# Patient Record
Sex: Female | Born: 1980 | Race: Black or African American | Hispanic: No | Marital: Married | Smoking: Never smoker
Health system: Southern US, Community
[De-identification: ages and names within clinical notes are randomized; demographics above are authoritative.]

## PROBLEM LIST (undated history)

## (undated) DIAGNOSIS — M1990 Unspecified osteoarthritis, unspecified site: Secondary | ICD-10-CM

## (undated) HISTORY — PX: CORONARY ANGIOGRAM: SHX5786

## (undated) HISTORY — DX: Unspecified osteoarthritis, unspecified site: M19.90

## (undated) HISTORY — PX: OTHER SURGICAL HISTORY: SHX169

---
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
## 2004-07-04 ENCOUNTER — Emergency Department (HOSPITAL_COMMUNITY): Admission: EM | Admit: 2004-07-04 | Discharge: 2004-07-04 | Payer: Self-pay | Admitting: Emergency Medicine

## 2005-07-11 ENCOUNTER — Emergency Department (HOSPITAL_COMMUNITY): Admission: EM | Admit: 2005-07-11 | Discharge: 2005-07-11 | Payer: Self-pay | Admitting: Family Medicine

## 2005-09-04 ENCOUNTER — Emergency Department (HOSPITAL_COMMUNITY): Admission: EM | Admit: 2005-09-04 | Discharge: 2005-09-04 | Payer: Self-pay | Admitting: Emergency Medicine

## 2006-01-27 ENCOUNTER — Emergency Department (HOSPITAL_COMMUNITY): Admission: EM | Admit: 2006-01-27 | Discharge: 2006-01-28 | Payer: Self-pay | Admitting: Emergency Medicine

## 2006-06-11 ENCOUNTER — Emergency Department (HOSPITAL_COMMUNITY): Admission: EM | Admit: 2006-06-11 | Discharge: 2006-06-11 | Payer: Self-pay | Admitting: Emergency Medicine

## 2006-08-20 ENCOUNTER — Emergency Department (HOSPITAL_COMMUNITY): Admission: EM | Admit: 2006-08-20 | Discharge: 2006-08-20 | Payer: Self-pay | Admitting: Emergency Medicine

## 2007-02-01 ENCOUNTER — Emergency Department (HOSPITAL_COMMUNITY): Admission: EM | Admit: 2007-02-01 | Discharge: 2007-02-01 | Payer: Self-pay | Admitting: Emergency Medicine

## 2011-02-18 LAB — POCT URINALYSIS DIP (DEVICE)
Glucose, UA: NEGATIVE
Hgb urine dipstick: NEGATIVE
Ketones, ur: 40 — AB
Nitrite: NEGATIVE
Operator id: 247071
Protein, ur: 30 — AB
Specific Gravity, Urine: 1.02
Urobilinogen, UA: 1
pH: 7

## 2011-02-18 LAB — DIFFERENTIAL
Basophils Relative: 0
Eosinophils Absolute: 0
Monocytes Relative: 9
Neutrophils Relative %: 54

## 2011-02-18 LAB — CBC
MCHC: 33.6
MCV: 88
Platelets: 283

## 2011-02-18 LAB — I-STAT 8, (EC8 V) (CONVERTED LAB)
Bicarbonate: 22.6
Glucose, Bld: 89
Hemoglobin: 14.6
Operator id: 239701
Sodium: 139
TCO2: 24
pCO2, Ven: 34.6 — ABNORMAL LOW

## 2012-03-17 ENCOUNTER — Telehealth: Payer: Self-pay

## 2012-03-17 NOTE — Telephone Encounter (Signed)
Pt Signed Release Form, she faxed to Me I have Copied all Records And Mailed to Pt Address at  [address] [place] [address] 03/17/12/KM

## 2018-11-07 ENCOUNTER — Other Ambulatory Visit: Payer: Self-pay | Admitting: Family Medicine

## 2018-11-07 ENCOUNTER — Other Ambulatory Visit (HOSPITAL_COMMUNITY): Payer: Self-pay | Admitting: Family Medicine

## 2018-11-07 DIAGNOSIS — R221 Localized swelling, mass and lump, neck: Secondary | ICD-10-CM

## 2018-11-07 DIAGNOSIS — I719 Aortic aneurysm of unspecified site, without rupture: Secondary | ICD-10-CM

## 2018-11-08 ENCOUNTER — Other Ambulatory Visit: Payer: Self-pay

## 2018-11-08 ENCOUNTER — Ambulatory Visit (HOSPITAL_COMMUNITY)
Admission: RE | Admit: 2018-11-08 | Discharge: 2018-11-08 | Disposition: A | Discharge status: Home / Self Care | Source: Ambulatory Visit | Attending: Family Medicine | Admitting: Family Medicine

## 2018-11-08 ENCOUNTER — Encounter (HOSPITAL_COMMUNITY): Payer: Self-pay

## 2018-11-08 DIAGNOSIS — I719 Aortic aneurysm of unspecified site, without rupture: Secondary | ICD-10-CM | POA: Diagnosis not present

## 2018-11-08 DIAGNOSIS — R221 Localized swelling, mass and lump, neck: Secondary | ICD-10-CM | POA: Insufficient documentation

## 2018-11-08 MED ORDER — IOHEXOL 300 MG/ML  SOLN
100.0000 mL | Freq: Once | INTRAMUSCULAR | Status: AC | PRN
  Administered 2018-11-08: 10:00:00 100 mL via INTRAVENOUS

## 2018-11-08 MED ORDER — SODIUM CHLORIDE (PF) 0.9 % IJ SOLN
INTRAMUSCULAR | Status: AC
  Filled 2018-11-08: qty 50

## 2018-11-10 ENCOUNTER — Emergency Department (HOSPITAL_COMMUNITY)
Admission: EM | Admit: 2018-11-10 | Discharge: 2018-11-10 | Disposition: A | Discharge status: Home / Self Care | Attending: Emergency Medicine | Admitting: Emergency Medicine

## 2018-11-10 ENCOUNTER — Other Ambulatory Visit: Payer: Self-pay

## 2018-11-10 ENCOUNTER — Encounter (HOSPITAL_COMMUNITY): Payer: Self-pay | Admitting: Emergency Medicine

## 2018-11-10 DIAGNOSIS — E876 Hypokalemia: Secondary | ICD-10-CM

## 2018-11-10 DIAGNOSIS — R202 Paresthesia of skin: Secondary | ICD-10-CM | POA: Insufficient documentation

## 2018-11-10 LAB — CBG MONITORING, ED: Glucose-Capillary: 85 mg/dL (ref 70–99)

## 2018-11-10 LAB — CBC
HCT: 39.8 % (ref 36.0–46.0)
Hemoglobin: 13.2 g/dL (ref 12.0–15.0)
MCH: 29.7 pg (ref 26.0–34.0)
MCHC: 33.2 g/dL (ref 30.0–36.0)
MCV: 89.6 fL (ref 80.0–100.0)
Platelets: 301 10*3/uL (ref 150–400)
RBC: 4.44 MIL/uL (ref 3.87–5.11)
RDW: 12.5 % (ref 11.5–15.5)
WBC: 6.3 10*3/uL (ref 4.0–10.5)
nRBC: 0 % (ref 0.0–0.2)

## 2018-11-10 LAB — BASIC METABOLIC PANEL
Anion gap: 7 (ref 5–15)
BUN: 5 mg/dL — ABNORMAL LOW (ref 6–20)
CO2: 25 mmol/L (ref 22–32)
Calcium: 9 mg/dL (ref 8.9–10.3)
Chloride: 109 mmol/L (ref 98–111)
Creatinine, Ser: 0.8 mg/dL (ref 0.44–1.00)
GFR calc Af Amer: >60 mL/min (ref >60)
GFR calc non Af Amer: >60 mL/min (ref >60)
Glucose, Bld: 98 mg/dL (ref 70–99)
Potassium: 3.3 mmol/L — ABNORMAL LOW (ref 3.5–5.1)
Sodium: 141 mmol/L (ref 135–145)

## 2018-11-10 LAB — URINALYSIS, ROUTINE W REFLEX MICROSCOPIC
Bilirubin Urine: NEGATIVE
Glucose, UA: NEGATIVE mg/dL
Hgb urine dipstick: NEGATIVE
Ketones, ur: NEGATIVE mg/dL
Nitrite: NEGATIVE
Protein, ur: NEGATIVE mg/dL
Specific Gravity, Urine: 1.017 (ref 1.005–1.030)
pH: 7 (ref 5.0–8.0)

## 2018-11-10 LAB — I-STAT BETA HCG BLOOD, ED (MC, WL, AP ONLY): I-stat hCG, quantitative: <5 m[IU]/mL (ref <5)

## 2018-11-10 MED ORDER — POTASSIUM CHLORIDE CRYS ER 20 MEQ PO TBCR: 40.0000 meq | EXTENDED_RELEASE_TABLET | Freq: Every day | ORAL | 0 refills | Status: DC

## 2018-11-10 MED ORDER — SODIUM CHLORIDE 0.9% FLUSH: 3.0000 mL | Freq: Once | INTRAVENOUS | Status: DC

## 2018-11-10 NOTE — ED Notes (Signed)
Pt verbalized understanding of discharge instructions and denies any further questions at this time.   

## 2018-11-10 NOTE — ED Provider Notes (Signed)
Emergency Department Provider Note   I have reviewed the triage vital signs and the nursing notes.   HISTORY  Chief Complaint Fatigue   HPI Christina House is a 38 y.o. female presents to the emergency department for evaluation of tingling in the hands and feet mainly when waking up from sleep.  Patient describes associated joint discomfort and some fatigue.  Patient states when she goes to sleep she feels fine and then wakes up after several hours and will have tingling in the hands, face.  Denies gasping for breath.  No sensation of being paralyzed after waking.  She tends to fall asleep without much difficulty.  No other medications.  No fevers or chills.  She is from [place] and is visiting family here for the next 2 weeks.  She had some right neck swelling and thought it could be a lymph node.  She had a CT scan of the neck along with abdomen and pelvis yesterday which were unremarkable. No HA.    History reviewed. No pertinent past medical history.  There are no active problems to display for this patient.   History reviewed. No pertinent surgical history.  Allergies Oysters [shellfish allergy]  No family history on file.  Social History Social History   Tobacco Use  . Smoking status: Never Smoker  . Smokeless tobacco: Never Used  Substance Use Topics  . Alcohol use: Yes  . Drug use: Not Currently    Review of Systems  Constitutional: No fever/chills Eyes: No visual changes. ENT: No sore throat. Cardiovascular: Denies chest pain. Respiratory: Denies shortness of breath. Gastrointestinal: No abdominal pain.  No nausea, no vomiting.  No diarrhea.  No constipation. Genitourinary: Negative for dysuria. Musculoskeletal: Negative for back pain. Positive diffuse joint pain.  Skin: Negative for rash. Neurological: Negative for headaches, focal weakness or numbness. Positive paresthesias.   10-point ROS otherwise negative.   ____________________________________________   PHYSICAL EXAM:  VITAL SIGNS: ED Triage Vitals  Enc Vitals Group     BP 11/10/18 1314 114/87     Pulse Rate 11/10/18 1314 67     Resp 11/10/18 1314 16     Temp 11/10/18 1314 98.3 F (36.8 C)     Temp Source 11/10/18 1314 Oral     SpO2 11/10/18 1314 100 %   Constitutional: Alert and oriented. Well appearing and in no acute distress. Eyes: Conjunctivae are normal.  Head: Atraumatic. Nose: No congestion/rhinnorhea. Mouth/Throat: Mucous membranes are moist. Neck: No stridor.  Cardiovascular: Normal rate, regular rhythm. Good peripheral circulation. Grossly normal heart sounds.   Respiratory: Normal respiratory effort.  No retractions. Lungs CTAB. Gastrointestinal: Soft and nontender. No distention.  Musculoskeletal: No lower extremity tenderness nor edema. No gross deformities of extremities. Neurologic:  Normal speech and language. No gross focal neurologic deficits are appreciated.  Skin:  Skin is warm, dry and intact. No rash noted.  ____________________________________________   LABS (all labs ordered are listed, but only abnormal results are displayed)  Labs Reviewed  BASIC METABOLIC PANEL - Abnormal; Notable for the following components:      Result Value   Potassium 3.3 (*)    BUN 5 (*)    All other components within normal limits  URINALYSIS, ROUTINE W REFLEX MICROSCOPIC - Abnormal; Notable for the following components:   APPearance HAZY (*)    Leukocytes,Ua SMALL (*)    Bacteria, UA RARE (*)    All other components within normal limits  CBC  CBG MONITORING, ED  I-STAT BETA HCG  BLOOD, ED ([facility], [facility], [facility] ONLY)   ____________________________________________  RADIOLOGY  None  ____________________________________________   PROCEDURES  Procedure(s) performed:   Procedures  None  ____________________________________________   INITIAL IMPRESSION / ASSESSMENT AND PLAN / ED COURSE  Pertinent labs & imaging  results that were available during my care of the patient were reviewed by me and considered in my medical decision making (see chart for details).   Patient presents to the emergency department with paresthesias mainly at night.  No active neuro deficits.  Neurologic exam is normal.  Labs ordered from triage in mild hypokalemia.  Plan for potassium supplementation.  UA pending.  Advised to follow-up with PCP for sleep study when she returns home.   UA equivocal. Plan for potassium supplementation and PCP follow up. Discussed ED return precautions.  ____________________________________________  FINAL CLINICAL IMPRESSION(S) / ED DIAGNOSES  Final diagnoses:  Paresthesias  Hypokalemia     MEDICATIONS GIVEN DURING THIS VISIT:  Medications  sodium chloride flush (NS) 0.9 % injection 3 mL (has no administration in time range)     NEW OUTPATIENT MEDICATIONS STARTED DURING THIS VISIT:  New Prescriptions   POTASSIUM CHLORIDE SA (K-DUR) 20 MEQ TABLET    Take 2 tablets (40 mEq total) by mouth daily for 5 days.    Note:  This document was prepared using Dragon voice recognition software and may include unintentional dictation errors.  Alona BeneJoshua Sendy Pluta, MD Emergency Medicine    Garyson Stelly, Arlyss RepressJoshua G, MD 11/10/18 [id number]

## 2018-11-10 NOTE — Discharge Instructions (Signed)
You were seen in the emergency department today with tingling in the hands and face.  Your potassium is slightly low.  I plan on giving you a replacement potassium over the next 5 days.  Please contact your PCP when you return home.  Return to the emergency department immediately with any new or suddenly worsening symptoms.

## 2018-11-10 NOTE — ED Triage Notes (Signed)
Pt with multiple complaints of malaise, joint pain, vision changes. Recently in [place] June 22 where she resides,  CT scan completed at [facility]. Denies fevers n/v/d.  Recently on penicillin, flagyl, and Suprex. rx by her sister who is a provider for treatment of urinary symptoms.

## 2018-11-13 ENCOUNTER — Telehealth: Payer: Self-pay | Admitting: Internal Medicine

## 2018-11-13 ENCOUNTER — Telehealth: Payer: Self-pay | Admitting: *Deleted

## 2018-11-13 NOTE — Telephone Encounter (Signed)
COVID-19 Pre-Screening Questions:11/13/18 ° ° °Do you currently have a fever (>100 °F), chills or unexplained body aches? NO  ° °Are you currently experiencing new cough, shortness of breath, sore throat, runny nose?NO °•  °Have you recently travelled outside the state of [place] in the last 14 days? NO °•  °Have you been in contact with someone that is currently pending confirmation of Covid19 testing or has been confirmed to have the Covid19 virus?  NO ° °**If the patient answers NO to ALL questions -  advise the patient to please call the clinic before coming to the office should any symptoms develop.  ° ° °

## 2018-11-14 ENCOUNTER — Encounter: Payer: Self-pay | Admitting: Internal Medicine

## 2018-11-14 ENCOUNTER — Ambulatory Visit (INDEPENDENT_AMBULATORY_CARE_PROVIDER_SITE_OTHER): Admitting: Internal Medicine

## 2018-11-14 ENCOUNTER — Other Ambulatory Visit: Payer: Self-pay

## 2018-11-14 VITALS — BP 107/72 | HR 102 | Temp 98.6°F | Wt 182.0 lb

## 2018-11-14 DIAGNOSIS — R197 Diarrhea, unspecified: Secondary | ICD-10-CM

## 2018-11-14 DIAGNOSIS — N390 Urinary tract infection, site not specified: Secondary | ICD-10-CM

## 2018-11-14 NOTE — Progress Notes (Signed)
Patient ID: Christina House, female   DOB: October 09, 1980, 38 y.o.   MRN: [id number]  HPI 38yo F referred for recurrent uti. She reports that she has had recurrent group b strep uti and concerned that she has resistant bacteria.  She has purchased diagnostic ua sticks from [place] since not feeling well and often checks her urine. Has been noticing elevated urobilinogen level.  She has finished various courses of abtx but most recently it is penicillin. She also has noticed having Diarrhea 4-6x per day, loose x 2 wk. Bilateral lower quadrant pain  She notices occasional foul smell urine but denies fever, dysuria, chills, no flank pain. Outpatient Encounter Medications as of 11/14/2018  Medication Sig  . Carboxymethylcellul-Glycerin (CLEAR EYES FOR DRY EYES) 1-0.25 % SOLN Place 1 drop into both eyes daily as needed (dry eyes).  . Multiple Vitamin (MULTIVITAMIN WITH MINERALS) TABS tablet Take 1 tablet by mouth daily.  . penicillin v potassium (VEETID) 500 MG tablet Take 500 mg by mouth 4 (four) times daily.  . potassium chloride SA (K-DUR) 20 MEQ tablet Take 2 tablets (40 mEq total) by mouth daily for 5 days.   No facility-administered encounter medications on file as of 11/14/2018.      There are no active problems to display for this patient.  Social: has 36 yo nad 38yo. 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 HIV Screening  12/31/1997  . TETANUS/TDAP  12/31/2001  . PAP SMEAR-Modifier  01/01/2004    Social History   Tobacco Use  . Smoking status: Never Smoker  . Smokeless tobacco: Never Used  Substance Use Topics  . Alcohol use: Yes  . Drug use: Not Currently  family history is not on file. Review of Systems 12 point ros is negative except what is mentioned in hpi Physical Exam   BP 107/72   Pulse (!) 102   Temp 98.6 F (37 C) (Oral)   Wt 182 lb (82.6 kg)   LMP 10/26/2018 (Exact Date)   .Physical Exam   Constitutional:  oriented to person, place, and time. appears well-developed and well-nourished. No distress.  HENT: [place]/AT, PERRLA, no scleral icterus Mouth/Throat: Oropharynx is clear and moist. No oropharyngeal exudate.  Cardiovascular: Normal rate, regular rhythm and normal heart sounds. Exam reveals no gallop and no friction rub.  No murmur heard.  Pulmonary/Chest: Effort normal and breath sounds normal. No respiratory distress.  has no wheezes.  Neck = supple, no nuchal rigidity Abdominal: Soft. Bowel sounds are normal.  exhibits no distension. There is no tenderness.  Lymphadenopathy: no cervical adenopathy. No axillary adenopathy Neurological: alert and oriented to person, place, and time.  Skin: Skin is warm and dry. No rash noted. No erythema.  Psychiatric:   Flat affect CBC Lab Results  Component Value Date   WBC 6.3 11/10/2018   RBC 4.44 11/10/2018   HGB 13.2 11/10/2018   HCT 39.8 11/10/2018   PLT 301 11/10/2018   MCV 89.6 11/10/2018   MCH 29.7 11/10/2018   MCHC 33.2 11/10/2018   RDW 12.5 11/10/2018    BMET Lab Results  Component Value Date   NA 141 11/10/2018   K 3.3 (L) 11/10/2018   CL 109 11/10/2018   CO2 25 11/10/2018   GLUCOSE 98 11/10/2018   BUN 5 (L) 11/10/2018   CREATININE 0.80 11/10/2018   CALCIUM 9.0 11/10/2018   GFRNONAA >60 11/10/2018   GFRAA >60 11/10/2018  Assessment and Plan  Recurrent uti = recommended not to use urine dipsticks for diagnosing herself. She is unlikely to have resistant streptococcal uti since strep is generally PCN sensitive. Did mention, many folks are colonized with group b strep and not necessarily need to eradicate. i question to whether really having recurrence vs functional cystitis process. We will  Refer to urology for urodynamics and bladder function eval  Diarrhea = Will check for cdifficile

## 2018-11-22 ENCOUNTER — Ambulatory Visit (INDEPENDENT_AMBULATORY_CARE_PROVIDER_SITE_OTHER): Admitting: Neurology

## 2018-11-22 ENCOUNTER — Other Ambulatory Visit: Payer: Self-pay

## 2018-11-22 ENCOUNTER — Encounter: Payer: Self-pay | Admitting: Neurology

## 2018-11-22 ENCOUNTER — Telehealth: Payer: Self-pay | Admitting: Neurology

## 2018-11-22 VITALS — BP 107/73 | HR 62 | Temp 97.5°F | Ht 66.0 in | Wt 183.0 lb

## 2018-11-22 DIAGNOSIS — R258 Other abnormal involuntary movements: Secondary | ICD-10-CM | POA: Diagnosis not present

## 2018-11-22 DIAGNOSIS — R2990 Unspecified symptoms and signs involving the nervous system: Secondary | ICD-10-CM

## 2018-11-22 DIAGNOSIS — M792 Neuralgia and neuritis, unspecified: Secondary | ICD-10-CM

## 2018-11-22 DIAGNOSIS — R29898 Other symptoms and signs involving the musculoskeletal system: Secondary | ICD-10-CM

## 2018-11-22 DIAGNOSIS — R29818 Other symptoms and signs involving the nervous system: Secondary | ICD-10-CM

## 2018-11-22 DIAGNOSIS — R202 Paresthesia of skin: Secondary | ICD-10-CM

## 2018-11-22 DIAGNOSIS — R197 Diarrhea, unspecified: Secondary | ICD-10-CM

## 2018-11-22 MED ORDER — GABAPENTIN 300 MG PO CAPS: 300.0000 mg | ORAL_CAPSULE | Freq: Three times a day (TID) | ORAL | 11 refills | Status: AC | PRN

## 2018-11-22 NOTE — Patient Instructions (Addendum)
MRI of the brain and cervical spine Blood work EMG/NCS Start Gabapentin at night before bed. May take it during the day as needed.   Peripheral Neuropathy Peripheral neuropathy is a type of nerve damage. It affects nerves that carry signals between the spinal cord and the arms, legs, and the rest of the body (peripheral nerves). It does not affect nerves in the spinal cord or brain. In peripheral neuropathy, one nerve or a group of nerves may be damaged. Peripheral neuropathy is a broad category that includes many specific nerve disorders, like diabetic neuropathy, hereditary neuropathy, and carpal tunnel syndrome. What are the causes? This condition may be caused by:  Diabetes. This is the most common cause of peripheral neuropathy.  Nerve injury.  Pressure or stress on a nerve that lasts a long time.  Lack (deficiency) of B vitamins. This can result from alcoholism, poor diet, or a restricted diet.  Infections.  Autoimmune diseases, such as rheumatoid arthritis and systemic lupus erythematosus.  Nerve diseases that are passed from parent to child (inherited).  Some medicines, such as cancer medicines (chemotherapy).  Poisonous (toxic) substances, such as lead and mercury.  Too little blood flowing to the legs.  Kidney disease.  Thyroid disease. In some cases, the cause of this condition is not known. What are the signs or symptoms? Symptoms of this condition depend on which of your nerves is damaged. Common symptoms include:  Loss of feeling (numbness) in the feet, hands, or both.  Tingling in the feet, hands, or both.  Burning pain.  Very sensitive skin.  Weakness.  Not being able to move a part of the body (paralysis).  Muscle twitching.  Clumsiness or poor coordination.  Loss of balance.  Not being able to control your bladder.  Feeling dizzy.  Sexual problems. How is this diagnosed? Diagnosing and finding the cause of peripheral neuropathy can be  difficult. Your health care provider will take your medical history and do a physical exam. A neurological exam will also be done. This involves checking things that are affected by your brain, spinal cord, and nerves (nervous system). For example, your health care provider will check your reflexes, how you move, and what you can feel. You may have other tests, such as:  Blood tests.  Electromyogram (EMG) and nerve conduction tests. These tests check nerve function and how well the nerves are controlling the muscles.  Imaging tests, such as CT scans or MRI to rule out other causes of your symptoms.  Removing a small piece of nerve to be examined in a lab (nerve biopsy). This is rare.  Removing and examining a small amount of the fluid that surrounds the brain and spinal cord (lumbar puncture). This is rare. How is this treated? Treatment for this condition may involve:  Treating the underlying cause of the neuropathy, such as diabetes, kidney disease, or vitamin deficiencies.  Stopping medicines that can cause neuropathy, such as chemotherapy.  Medicine to relieve pain. Medicines may include: ? Prescription or over-the-counter pain medicine. ? Antiseizure medicine. ? Antidepressants. ? Pain-relieving patches that are applied to painful areas of skin.  Surgery to relieve pressure on a nerve or to destroy a nerve that is causing pain.  Physical therapy to help improve movement and balance.  Devices to help you move around (assistive devices). Follow these instructions at home: Medicines  Take over-the-counter and prescription medicines only as told by your health care provider. Do not take any other medicines without first asking your health  care provider.  Do not drive or use heavy machinery while taking prescription pain medicine. Lifestyle   Do not use any products that contain nicotine or tobacco, such as cigarettes and e-cigarettes. Smoking keeps blood from reaching damaged  nerves. If you need help quitting, ask your health care provider.  Avoid or limit alcohol. Too much alcohol can cause a vitamin B deficiency, and vitamin B is needed for healthy nerves.  Eat a healthy diet. This includes: ? Eating foods that are high in fiber, such as fresh fruits and vegetables, whole grains, and beans. ? Limiting foods that are high in fat and processed sugars, such as fried or sweet foods. General instructions   If you have diabetes, work closely with your health care provider to keep your blood sugar under control.  If you have numbness in your feet: ? Check every day for signs of injury or infection. Watch for redness, warmth, and swelling. ? Wear padded socks and comfortable shoes. These help protect your feet.  Develop a good support system. Living with peripheral neuropathy can be stressful. Consider talking with a mental health specialist or joining a support group.  Use assistive devices and attend physical therapy as told by your health care provider. This may include using a walker or a cane.  Keep all follow-up visits as told by your health care provider. This is important. Contact a health care provider if:  You have new signs or symptoms of peripheral neuropathy.  You are struggling emotionally from dealing with peripheral neuropathy.  Your pain is not well-controlled. Get help right away if:  You have an injury or infection that is not healing normally.  You develop new weakness in an arm or leg.  You fall frequently. Summary  Peripheral neuropathy is when the nerves in the arms, or legs are damaged, resulting in numbness, weakness, or pain.  There are many causes of peripheral neuropathy, including diabetes, pinched nerves, vitamin deficiencies, autoimmune disease, and hereditary conditions.  Diagnosing and finding the cause of peripheral neuropathy can be difficult. Your health care provider will take your medical history, do a physical  exam, and do tests, including blood tests and nerve function tests.  Treatment involves treating the underlying cause of the neuropathy and taking medicines to help control pain. Physical therapy and assistive devices may also help. This information is not intended to replace advice given to you by your health care provider. Make sure you discuss any questions you have with your health care provider. Document Released: 04/16/2002 Document Revised: 04/08/2017 Document Reviewed: 07/05/2016 Elsevier Patient Education  2020 [employer]    Electromyoneurogram Electromyoneurogram is a test to check how well your muscles and nerves are working. This procedure includes the combined use of electromyogram (EMG) and nerve conduction study (NCS). EMG is used to look for muscular disorders. NCS, which is also called electroneurogram, measures how well your nerves are controlling your muscles. The procedures are usually done together to check if your muscles and nerves are healthy. If the results of the tests are abnormal, this may indicate disease or injury, such as a neuromuscular disease or peripheral nerve damage. Tell a health care provider about:  Any allergies you have.  All medicines you are taking, including vitamins, herbs, eye drops, creams, and over-the-counter medicines.  Any problems you or family members have had with anesthetic medicines.  Any blood disorders you have.  Any surgeries you have had.  Any medical conditions you have.  If you have a  pacemaker.  Whether you are pregnant or may be pregnant. What are the risks? Generally, this is a safe procedure. However, problems may occur, including:  Infection where the electrodes were inserted.  Bleeding. What happens before the procedure? Medicines Ask your health care provider about:  Changing or stopping your regular medicines. This is especially important if you are taking diabetes medicines or blood thinners.  Taking  medicines such as aspirin and ibuprofen. These medicines can thin your blood. Do not take these medicines unless your health care provider tells you to take them.  Taking over-the-counter medicines, vitamins, herbs, and supplements. General instructions  Your health care provider may ask you to avoid: ? Beverages that have caffeine, such as coffee and tea. ? Any products that contain nicotine or tobacco. These products include cigarettes, e-cigarettes, and chewing tobacco. If you need help quitting, ask your health care provider.  Do not use lotions or creams on the same day that you will be having the procedure. What happens during the procedure? For EMG   Your health care provider will ask you to stay in a position so that he or she can access the muscle that will be studied. You may be standing, sitting, or lying down.  You may be given a medicine that numbs the area (local anesthetic).  A very thin needle that has an electrode will be inserted into your muscle.  Another small electrode will be placed on your skin near the muscle.  Your health care provider will ask you to continue to remain still.  The electrodes will send a signal that tells about the electrical activity of your muscles. You may see this on a monitor or hear it in the room.  After your muscles have been studied at rest, your health care provider will ask you to contract or flex your muscles. The electrodes will send a signal that tells about the electrical activity of your muscles.  Your health care provider will remove the electrodes and the electrode needles when the procedure is finished. The procedure may vary among health care providers and hospitals. For NCS   An electrode that records your nerve activity (recording electrode) will be placed on your skin by the muscle that is being studied.  An electrode that is used as a reference (reference electrode) will be placed near the recording electrode.  A  paste or gel will be applied to your skin between the recording electrode and the reference electrode.  Your nerve will be stimulated with a mild shock. Your health care provider will measure how much time it takes for your muscle to react.  Your health care provider will remove the electrodes and the gel when the procedure is finished. The procedure may vary among health care providers and hospitals. What happens after the procedure?  It is up to you to get the results of your procedure. Ask your health care provider, or the department that is doing the procedure, when your results will be ready.  Your health care provider may: ? Give you medicines for any pain. ? Monitor the insertion sites to make sure that bleeding stops. Summary  Electromyoneurogram is a test to check how well your muscles and nerves are working.  If the results of the tests are abnormal, this may indicate disease or injury.  This is a safe procedure. However, problems may occur, such as bleeding and infection.  Your health care provider will do two tests to complete this procedure. One checks your  muscles (EMG) and another checks your nerves (NCS).  It is up to you to get the results of your procedure. Ask your health care provider, or the department that is doing the procedure, when your results will be ready. This information is not intended to replace advice given to you by your health care provider. Make sure you discuss any questions you have with your health care provider. Document Released: 08/27/2004 Document Revised: 01/10/2018 Document Reviewed: 12/23/2017 Elsevier Patient Education  [employer].  Gabapentin capsules or tablets What is this medicine? GABAPENTIN (GA ba pen tin) is used to control seizures in certain types of epilepsy. It is also used to treat certain types of nerve pain. This medicine may be used for other purposes; ask your health care provider or pharmacist if you have  questions. COMMON BRAND NAME(S): Active-PAC with Gabapentin, Gabarone, Neurontin What should I tell my health care provider before I take this medicine? They need to know if you have any of these conditions:  history of drug abuse or alcohol abuse problem  kidney disease  lung or breathing disease  suicidal thoughts, plans, or attempt; a previous suicide attempt by you or a family member  an unusual or allergic reaction to gabapentin, other medicines, foods, dyes, or preservatives  pregnant or trying to get pregnant  breast-feeding How should I use this medicine? Take this medicine by mouth with a glass of water. Follow the directions on the prescription label. You can take it with or without food. If it upsets your stomach, take it with food. Take your medicine at regular intervals. Do not take it more often than directed. Do not stop taking except on your doctor's advice. If you are directed to break the 600 or 800 mg tablets in half as part of your dose, the extra half tablet should be used for the next dose. If you have not used the extra half tablet within 28 days, it should be thrown away. A special MedGuide will be given to you by the pharmacist with each prescription and refill. Be sure to read this information carefully each time. Talk to your pediatrician regarding the use of this medicine in children. While this drug may be prescribed for children as young as 3 years for selected conditions, precautions do apply. Overdosage: If you think you have taken too much of this medicine contact a poison control center or emergency room at once. NOTE: This medicine is only for you. Do not share this medicine with others. What if I miss a dose? If you miss a dose, take it as soon as you can. If it is almost time for your next dose, take only that dose. Do not take double or extra doses. What may interact with this medicine? This medicine may interact with the following medications:   alcohol  antihistamines for allergy, cough, and cold  certain medicines for anxiety or sleep  certain medicines for depression like amitriptyline, fluoxetine, sertraline  certain medicines for seizures like phenobarbital, primidone  certain medicines for stomach problems  general anesthetics like halothane, isoflurane, methoxyflurane, propofol  local anesthetics like lidocaine, pramoxine, tetracaine  medicines that relax muscles for surgery  narcotic medicines for pain  phenothiazines like chlorpromazine, mesoridazine, prochlorperazine, thioridazine This list may not describe all possible interactions. Give your health care provider a list of all the medicines, herbs, non-prescription drugs, or dietary supplements you use. Also tell them if you smoke, drink alcohol, or use illegal drugs. Some items may interact with  your medicine. What should I watch for while using this medicine? Visit your doctor or health care provider for regular checks on your progress. You may want to keep a record at home of how you feel your condition is responding to treatment. You may want to share this information with your doctor or health care provider at each visit. You should contact your doctor or health care provider if your seizures get worse or if you have any new types of seizures. Do not stop taking this medicine or any of your seizure medicines unless instructed by your doctor or health care provider. Stopping your medicine suddenly can increase your seizures or their severity. This medicine may cause serious skin reactions. They can happen weeks to months after starting the medicine. Contact your health care provider right away if you notice fevers or flu-like symptoms with a rash. The rash may be red or purple and then turn into blisters or peeling of the skin. Or, you might notice a red rash with swelling of the face, lips or lymph nodes in your neck or under your arms. Wear a medical identification  bracelet or chain if you are taking this medicine for seizures, and carry a card that lists all your medications. You may get drowsy, dizzy, or have blurred vision. Do not drive, use machinery, or do anything that needs mental alertness until you know how this medicine affects you. To reduce dizzy or fainting spells, do not sit or stand up quickly, especially if you are an older patient. Alcohol can increase drowsiness and dizziness. Avoid alcoholic drinks. Your mouth may get dry. Chewing sugarless gum or sucking hard candy, and drinking plenty of water will help. The use of this medicine may increase the chance of suicidal thoughts or actions. Pay special attention to how you are responding while on this medicine. Any worsening of mood, or thoughts of suicide or dying should be reported to your health care provider right away. Women who become pregnant while using this medicine may enroll in the [place] Antiepileptic Drug Pregnancy Registry by calling [phone]. This registry collects information about the safety of antiepileptic drug use during pregnancy. What side effects may I notice from receiving this medicine? Side effects that you should report to your doctor or health care professional as soon as possible:  allergic reactions like skin rash, itching or hives, swelling of the face, lips, or tongue  breathing problems  rash, fever, and swollen lymph nodes  redness, blistering, peeling or loosening of the skin, including inside the mouth  suicidal thoughts, mood changes Side effects that usually do not require medical attention (report to your doctor or health care professional if they continue or are bothersome):  dizziness  drowsiness  headache  nausea, vomiting  swelling of ankles, feet, hands  tiredness This list may not describe all possible side effects. Call your doctor for medical advice about side effects. You may report side effects to FDA at 1-800-FDA-1088.  Where should I keep my medicine? Keep out of reach of children. This medicine may cause accidental overdose and death if it taken by other adults, children, or pets. Mix any unused medicine with a substance like cat litter or coffee grounds. Then throw the medicine away in a sealed container like a sealed bag or a coffee can with a lid. Do not use the medicine after the expiration date. Store at room temperature between 15 and 30 degrees C (59 and 86 degrees F). NOTE: This sheet is  a summary. It may not cover all possible information. If you have questions about this medicine, talk to your doctor, pharmacist, or health care provider.  2020 Elsevier/[employer] (2018-07-28 14:16:43)

## 2018-11-22 NOTE — Progress Notes (Signed)
[facility]    Provider:  Dr Lucia GaskinsAhern Requesting Provider: Alveta HeimlichJohnson, Angela E, FNP Primary Care Provider:  Alveta HeimlichJohnson, Angela E, FNP  CC:  Numbness in the feet and hands and back of neck  HPI:  Christina House is a 38 y.o. female here as requested by Alveta HeimlichJohnson, Angela E, FNP for numbness in the feet and hand and back of head. I reviewed notes from Alveta HeimlichJohnson, Angela E, FNP.  This is a 38 year old patient who [facility] complaints of right-sided pain, lower abdominal pain, malaise, fatigue, right hip pain, tender area in the right side of neck, blurry vision, eye swelling, tingling in the back of her head and fingertips, generally all over not feeling well for several months.  She has been seeking treatment for several months now at different facilities for other unexplained symptoms.  She is also been seen in the emergency room for fatigue, tingling in the hands and feet mainly when waking up from sleep.   Symptoms started a few months ago. She felt pins and needles in the legs entire leg up to waist, she had strep prior, she stood up and it "hit her", lasted only a few seconds. Then in the hands. Then to the feet. And bee stings in her limbs and if the back of the head. Symptoms last a few seconds. Her fingertips are numb all the time and wakes up with the symptoms, the symptoms happen together in the hands and legs. She had felt electric shoot down her neck. The symptoms happen randomly but not daily. Wonda OldsCousin is here and provides much information. She does not have a full family history, she does not endorse neurologic symptoms when younger. She had shooting pain in the head from the neck that can be painful, the other symptoms are uncomfortable, she also has weakness as she woke up one morning and she had a hard time walking felt like her muscles were not working and walking "sideways" per her cousin and gait abnormality. Also off balance for a few  days.   Reviewed notes, labs and imaging from outside physicians, which showed:  I reviewed CT scan soft tissues of the neck report which showed a normal neck no evidence of mass or adenopathy.  I also reviewed CT of the head which was normal, report I do not have the images.  I also reviewed lab work which showed negative hCG blood, BMP was unremarkable.  CBC was normal.  These labs were drawn on November 14, 2018 earlier this month.  I reviewed notes from Alveta HeimlichJohnson, Angela E, FNP.  This is a 38 year old patient who [facility] complaints of right-sided pain, lower abdominal pain, malaise, fatigue, right hip pain, tender area in the right side of neck, blurry vision, eye swelling, tingling in the back of her head and fingertips, generally all over not feeling well for several months.  She has been seeking treatment for several months now at different facilities for other unexplained symptoms.  She is also been to the emergency room for tingling in her face and hands mostly when she wakes up.  Patient was seen in the emergency room earlier this month.  She presented with tingling in the hands and feet mainly when waking up from sleep.  She describes associated joint discomfort and fatigue.  She wakes up with tingling in the hands and face.  She is from [place] and is visiting family here  for the next 2 weeks.  She had a CT scan of the neck along with abdomen and pelvis the day prior which were unremarkable.   Review of Systems: Patient complains of symptoms per HPI as well as the following symptoms: numbness and tingling Pertinent negatives and positives per HPI. All others negative.   Social History   Socioeconomic History   Marital status: Married    Spouse name: Not on file   Number of children: 2   Years of education: Not on file   Highest education level: Bachelor's degree (e.g., BA, AB, BS)  Occupational History   Not on file  Social Needs    Financial resource strain: Not on file   Food insecurity    Worry: Not on file    Inability: Not on file   Transportation needs    Medical: Not on file    Non-medical: Not on file  Tobacco Use   Smoking status: Never Smoker   Smokeless tobacco: Never Used  Substance and Sexual Activity   Alcohol use: Yes    Comment: special occasions   Drug use: Never   Sexual activity: Yes    Birth control/protection: None  Lifestyle   Physical activity    Days per week: Not on file    Minutes per session: Not on file   Stress: Not on file  Relationships   Social connections    Talks on phone: Not on file    Gets together: Not on file    Attends religious service: Not on file    Active member of club or organization: Not on file    Attends meetings of clubs or organizations: Not on file    Relationship status: Not on file   Intimate partner violence    Fear of current or ex partner: Not on file    Emotionally abused: Not on file    Physically abused: Not on file    Forced sexual activity: Not on file  Other Topics Concern   Not on file  Social History Narrative   Lives at home with her spouse & children   Right handed   Caffeine: maybe 1 cup of coffee or tea, not daily    Family History  Problem Relation Age of Onset   High blood pressure Mother    Stroke Mother    Osteoarthritis Mother    High blood pressure Father    Arthritis Father    Gout Father    Neuropathy Neg Hx     Past Medical History:  Diagnosis Date   OA (osteoarthritis)     Patient Active Problem List   Diagnosis Date Noted   Paresthesias after bacterial/viral infection 11/22/2018    Past Surgical History:  Procedure Laterality Date   biopsy of L foot mass     benign per pt report   CORONARY ANGIOGRAM      Current Outpatient Medications  Medication Sig Dispense Refill   Carboxymethylcellul-Glycerin (CLEAR EYES FOR DRY EYES) 1-0.25 % SOLN Place 1 drop into both eyes daily as  needed (dry eyes).     Multiple Vitamin (MULTIVITAMIN WITH MINERALS) TABS tablet Take 1 tablet by mouth daily.     gabapentin (NEURONTIN) 300 MG capsule Take 1 capsule (300 mg total) by mouth 3 (three) times daily as needed. 90 capsule 11   No current facility-administered medications for this visit.     Allergies as of 11/22/2018 - Review Complete 11/22/2018  Allergen Reaction Noted   Oysters [shellfish allergy]  Rash 11/10/2018    Vitals: BP 107/73 (BP Location: Right Arm, Patient Position: Sitting)    Pulse 62    Temp (!) 97.5 F (36.4 C) Comment: cousin 98   Ht 5\' 6"  (1.676 m)    Wt 183 lb (83 kg)    LMP 10/26/2018 (Exact Date)    BMI 29.54 kg/m  Last Weight:  Wt Readings from Last 1 Encounters:  11/22/18 183 lb (83 kg)   Last Height:   Ht Readings from Last 1 Encounters:  11/22/18 5\' 6"  (1.676 m)     Physical exam: Exam: Gen: NAD, conversant, well nourised, obese, well groomed                     CV: RRR, no MRG. No Carotid Bruits. No peripheral edema, warm, nontender Eyes: Conjunctivae clear without exudates or hemorrhage  Neuro: Detailed Neurologic Exam  Speech:    Speech is normal; fluent and spontaneous with normal comprehension.  Cognition:    The patient is oriented to person, place, and time;     recent and remote memory intact;     language fluent;     normal attention, concentration,     fund of knowledge Cranial Nerves:    The pupils are equal, round, and reactive to light. The fundi are normal and spontaneous venous pulsations are present. Visual fields are full to finger confrontation. Extraocular movements are intact. Trigeminal sensation is intact and the muscles of mastication are normal. The face is symmetric. The palate elevates in the midline. Hearing intact. Voice is normal. Shoulder shrug is normal. The tongue has normal motion without fasciculations.   Coordination:    Normal finger to nose and heel to shin. Normal rapid alternating  movements.   Gait:    Heel-toe and tandem gait are normal.   Motor Observation: left triceps 4+/5,     No asymmetry, no atrophy, and no involuntary movements noted. Tone:    Normal muscle tone.    Posture:    Posture is normal. normal erect    Strength:    Strength is V/V in the upper and lower limbs.      Sensation: intact to LT, pin prick, vibration, temp, proprioception. +Lheurmitte.     Reflex Exam:  DTR's:    Deep tendon reflexes in the upper and lower extremities are 2+ bilaterally.   Toes:    The toes are downgoing bilaterally.   Clonus:    2 beats at AJs which may be normal for this age    Assessment/Plan:  38 year old with unexplained neurologic symptoms  - Developed paresthesias after UTI and strep - may be viral/bacterial etiology - MRI of the brain and cervical spine to evaluate for demyelinating lesions, MS, other etiologies due to focal weakness on exam, abnormal neurologic symptoms, clonus (2 beats may be normal when younger but patient is 37). - emg/ncs for perpheral causes of symptoms - one leg and both arms.  - lab work : she has had tsh, hiv, rpr, hgba1c 5.1 will not repeat - gabapentin up to 3x a day, start at bedtime - she does not have a pcp, needs to get a pcp for a thorough evaluation - Fall precautions discussed   Orders Placed This Encounter  Procedures   MR BRAIN W WO CONTRAST   MR CERVICAL SPINE W WO CONTRAST   Sedimentation rate   Sjogren's syndrome antibods(ssa + ssb)   Pan-ANCA   B12 and Folate Panel  Rheumatoid factor   Heavy metals, blood   Vitamin B6   B. burgdorfi Antibody   Vitamin B1   Methylmalonic acid, serum   CBC   Comprehensive metabolic panel   ANA   ANA, IFA (with reflex)   NCV with EMG(electromyography)   Meds ordered this encounter  Medications   gabapentin (NEURONTIN) 300 MG capsule    Sig: Take 1 capsule (300 mg total) by mouth 3 (three) times daily as needed.    Dispense:  90 capsule     Refill:  11    Cc: Alveta HeimlichJohnson, Angela E, FNP,    A total of 90 minutes was spent face-to-face with this patient. Over half this time was spent on counseling patient on the  1. Neuropathic pain   2. Multiple neurological symptoms   3. Left arm weakness   4. Clonus   5. Positive Lhermitte's sign   6. Paresthesias after bacterial/viral infection    diagnosis and different diagnostic and therapeutic options, counseling and coordination of care, risks ans benefits of management, compliance, or risk factor reduction and education.     Naomie DeanAntonia Emie Sommerfeld, MD  [facility] [address] [place], [place] [id number]  Phone [phone] Fax [phone]

## 2018-11-22 NOTE — Telephone Encounter (Signed)
[place] order sent to GI. No auth they will reach out to the patient to schedule.

## 2018-11-23 LAB — CLOSTRIDIUM DIFFICILE TOXIN B, QUALITATIVE, REAL-TIME PCR: Toxigenic C. Difficile by PCR: NOT DETECTED

## 2018-11-25 ENCOUNTER — Other Ambulatory Visit: Payer: Self-pay | Admitting: Physician Assistant

## 2018-11-25 DIAGNOSIS — R590 Localized enlarged lymph nodes: Secondary | ICD-10-CM

## 2018-11-26 ENCOUNTER — Emergency Department (HOSPITAL_COMMUNITY)
Admission: EM | Admit: 2018-11-26 | Discharge: 2018-11-26 | Disposition: A | Discharge status: Home / Self Care | Attending: Emergency Medicine | Admitting: Emergency Medicine

## 2018-11-26 ENCOUNTER — Other Ambulatory Visit: Payer: Self-pay

## 2018-11-26 ENCOUNTER — Emergency Department (HOSPITAL_COMMUNITY)

## 2018-11-26 ENCOUNTER — Encounter (HOSPITAL_COMMUNITY): Payer: Self-pay | Admitting: Emergency Medicine

## 2018-11-26 DIAGNOSIS — R202 Paresthesia of skin: Secondary | ICD-10-CM | POA: Diagnosis not present

## 2018-11-26 DIAGNOSIS — Z91013 Allergy to seafood: Secondary | ICD-10-CM | POA: Insufficient documentation

## 2018-11-26 DIAGNOSIS — M542 Cervicalgia: Secondary | ICD-10-CM | POA: Diagnosis not present

## 2018-11-26 DIAGNOSIS — E876 Hypokalemia: Secondary | ICD-10-CM | POA: Insufficient documentation

## 2018-11-26 DIAGNOSIS — Z79899 Other long term (current) drug therapy: Secondary | ICD-10-CM | POA: Insufficient documentation

## 2018-11-26 DIAGNOSIS — N939 Abnormal uterine and vaginal bleeding, unspecified: Secondary | ICD-10-CM | POA: Insufficient documentation

## 2018-11-26 DIAGNOSIS — I10 Essential (primary) hypertension: Secondary | ICD-10-CM | POA: Diagnosis present

## 2018-11-26 LAB — COMPREHENSIVE METABOLIC PANEL
ALT: 33 IU/L — ABNORMAL HIGH (ref 0–32)
AST: 10 IU/L (ref 0–40)
Albumin/Globulin Ratio: 2 (ref 1.2–2.2)
Albumin: 4.5 g/dL (ref 3.8–4.8)
Alkaline Phosphatase: 49 IU/L (ref 39–117)
BUN/Creatinine Ratio: 11 (ref 9–23)
BUN: 9 mg/dL (ref 6–20)
Bilirubin Total: 0.3 mg/dL (ref 0.0–1.2)
CO2: 23 mmol/L (ref 20–29)
Calcium: 9.5 mg/dL (ref 8.7–10.2)
Chloride: 105 mmol/L (ref 96–106)
Creatinine, Ser: 0.81 mg/dL (ref 0.57–1.00)
GFR calc Af Amer: 109 mL/min/{1.73_m2} (ref >59)
GFR calc non Af Amer: 94 mL/min/{1.73_m2} (ref >59)
Globulin, Total: 2.2 g/dL (ref 1.5–4.5)
Glucose: 82 mg/dL (ref 65–99)
Potassium: 3.7 mmol/L (ref 3.5–5.2)
Sodium: 144 mmol/L (ref 134–144)
Total Protein: 6.7 g/dL (ref 6.0–8.5)

## 2018-11-26 LAB — BASIC METABOLIC PANEL
Anion gap: 8 (ref 5–15)
BUN: 5 mg/dL — ABNORMAL LOW (ref 6–20)
CO2: 23 mmol/L (ref 22–32)
Calcium: 9.1 mg/dL (ref 8.9–10.3)
Chloride: 109 mmol/L (ref 98–111)
Creatinine, Ser: 0.74 mg/dL (ref 0.44–1.00)
GFR calc Af Amer: >60 mL/min (ref >60)
GFR calc non Af Amer: >60 mL/min (ref >60)
Glucose, Bld: 93 mg/dL (ref 70–99)
Potassium: 3.3 mmol/L — ABNORMAL LOW (ref 3.5–5.1)
Sodium: 140 mmol/L (ref 135–145)

## 2018-11-26 LAB — PAN-ANCA
ANCA Proteinase 3: <3.5 U/mL (ref 0.0–3.5)
Atypical pANCA: <1:20 {titer}
C-ANCA: <1:20 {titer}
Myeloperoxidase Ab: <9 U/mL (ref 0.0–9.0)
P-ANCA: <1:20 {titer}

## 2018-11-26 LAB — CBC
HCT: 44.6 % (ref 36.0–46.0)
Hematocrit: 43.9 % (ref 34.0–46.6)
Hemoglobin: 14.4 g/dL (ref 11.1–15.9)
Hemoglobin: 14.6 g/dL (ref 12.0–15.0)
MCH: 29.8 pg (ref 26.6–33.0)
MCH: 29.4 pg (ref 26.0–34.0)
MCHC: 32.8 g/dL (ref 31.5–35.7)
MCHC: 32.7 g/dL (ref 30.0–36.0)
MCV: 91 fL (ref 79–97)
MCV: 89.9 fL (ref 80.0–100.0)
Platelets: 325 10*3/uL (ref 150–450)
Platelets: 320 10*3/uL (ref 150–400)
RBC: 4.83 x10E6/uL (ref 3.77–5.28)
RBC: 4.96 MIL/uL (ref 3.87–5.11)
RDW: 13.2 % (ref 11.7–15.4)
RDW: 12.6 % (ref 11.5–15.5)
WBC: 9.5 10*3/uL (ref 3.4–10.8)
WBC: 6.5 10*3/uL (ref 4.0–10.5)
nRBC: 0 % (ref 0.0–0.2)

## 2018-11-26 LAB — HEAVY METALS, BLOOD
Arsenic: 7 ug/L (ref 2–23)
Lead, Blood: NOT DETECTED ug/dL (ref 0–4)
Mercury: 1.7 ug/L (ref 0.0–14.9)

## 2018-11-26 LAB — URINALYSIS, ROUTINE W REFLEX MICROSCOPIC
Bilirubin Urine: NEGATIVE
Glucose, UA: NEGATIVE mg/dL
Hgb urine dipstick: NEGATIVE
Ketones, ur: 20 mg/dL — AB
Nitrite: NEGATIVE
Protein, ur: NEGATIVE mg/dL
Specific Gravity, Urine: 1.004 — ABNORMAL LOW (ref 1.005–1.030)
pH: 7 (ref 5.0–8.0)

## 2018-11-26 LAB — TROPONIN I (HIGH SENSITIVITY)
Troponin I (High Sensitivity): 2 ng/L (ref <18)
Troponin I (High Sensitivity): 3 ng/L (ref <18)

## 2018-11-26 LAB — I-STAT BETA HCG BLOOD, ED (MC, WL, AP ONLY): I-stat hCG, quantitative: <5 m[IU]/mL (ref <5)

## 2018-11-26 LAB — SJOGREN'S SYNDROME ANTIBODS(SSA + SSB)
ENA SSA (RO) Ab: <0.2 AI (ref 0.0–0.9)
ENA SSB (LA) Ab: <0.2 AI (ref 0.0–0.9)

## 2018-11-26 LAB — METHYLMALONIC ACID, SERUM: Methylmalonic Acid: 117 nmol/L (ref 0–378)

## 2018-11-26 LAB — SEDIMENTATION RATE: Sed Rate: 5 mm/hr (ref 0–32)

## 2018-11-26 LAB — ANA: ANA Titer 1: NEGATIVE

## 2018-11-26 LAB — VITAMIN B6: Vitamin B6: 37.6 ug/L — ABNORMAL HIGH (ref 2.0–32.8)

## 2018-11-26 LAB — VITAMIN B1: Thiamine: 107.2 nmol/L (ref 66.5–200.0)

## 2018-11-26 LAB — B12 AND FOLATE PANEL
Folate: 9.3 ng/mL (ref >3.0)
Vitamin B-12: 785 pg/mL (ref 232–1245)

## 2018-11-26 LAB — PREGNANCY, URINE: Preg Test, Ur: NEGATIVE

## 2018-11-26 LAB — RHEUMATOID FACTOR: Rhuematoid fact SerPl-aCnc: <10 IU/mL (ref 0.0–13.9)

## 2018-11-26 LAB — B. BURGDORFI ANTIBODIES: Lyme IgG/IgM Ab: <0.91 {ISR} (ref 0.00–0.90)

## 2018-11-26 MED ORDER — POTASSIUM CHLORIDE CRYS ER 20 MEQ PO TBCR
20.0000 meq | EXTENDED_RELEASE_TABLET | Freq: Once | ORAL | Status: AC
  Administered 2018-11-26: 20 meq via ORAL
  Filled 2018-11-26: qty 1

## 2018-11-26 MED ORDER — SODIUM CHLORIDE 0.9% FLUSH: 3.0000 mL | Freq: Once | INTRAVENOUS | Status: DC

## 2018-11-26 NOTE — ED Notes (Signed)
Pt only has BP complaints during night, states that it is ok during the day-- does not take BP meds

## 2018-11-26 NOTE — Discharge Instructions (Addendum)
You were seen in the emergency department for intermittent elevated blood pressure, tingling in your head and your hands along with some bandlike tightness in your right leg.  You had blood work EKG CAT scan of your head and a urinalysis that did not show any serious findings.  It will be important for you to follow-up with your neurologist for continued work-up of your symptoms.  Please return if any concerns.

## 2018-11-26 NOTE — ED Triage Notes (Addendum)
Pt states for weeks she has woken up with head tingling in the middle of the night and checking her bp and it its 150s/130s. Pt also sates she has a pulsating feeling in her neck. BP is normal during the daytime. Pt also reports irregular period for 2 months total now with intermittent vaginal bleeding, also reports her whole right leg feels like it has "rubber bands" wrapped all around it. Pt also reports intermittent dull right sided chest pain for the past 2-3 weeks.

## 2018-11-26 NOTE — ED Provider Notes (Signed)
[facility] EMERGENCY DEPARTMENT Provider Note   CSN: [id number] Arrival date & time: 11/26/18  1937     History   Chief Complaint Chief Complaint  Patient presents with  . Hypertension    HPI Christina House is a 38 y.o. female.  She is complaining of few weeks of intermittent tingling in her head and hands and face.  It usually wakes her up at night and she has been checking her blood pressure and finding to be elevated at night.  She says her blood pressure is normal during the day.  She also feels like she is got some rubber band sensation wrapped around her lower thigh and then her upper leg on the right.  She also said she had some irregular bleeding vaginally this month.  She has seen her primary for this and has been to the ER once earlier this month.  She also sees neurology and they are working her up for this.     The history is provided by the patient.  Hypertension This is a new problem. The current episode started more than 1 week ago. The problem occurs daily. The problem has not changed since onset.Pertinent negatives include no chest pain, no abdominal pain, no headaches and no shortness of breath. Nothing aggravates the symptoms. Nothing relieves the symptoms. She has tried nothing for the symptoms. The treatment provided no relief.    Past Medical History:  Diagnosis Date  . OA (osteoarthritis)     Patient Active Problem List   Diagnosis Date Noted  . Paresthesias after bacterial/viral infection 11/22/2018    Past Surgical History:  Procedure Laterality Date  . biopsy of L foot mass     benign per pt report  . CORONARY ANGIOGRAM       OB History   No obstetric history on file.      Home Medications    Prior to Admission medications   Medication Sig Start Date End Date Taking? Authorizing Provider  Carboxymethylcellul-Glycerin (CLEAR EYES FOR DRY EYES) 1-0.25 % SOLN Place 1 drop into both eyes daily as needed (dry eyes).     [provider]  gabapentin (NEURONTIN) 300 MG capsule Take 1 capsule (300 mg total) by mouth 3 (three) times daily as needed. 11/22/18   Melvenia Beam, MD  Multiple Vitamin (MULTIVITAMIN WITH MINERALS) TABS tablet Take 1 tablet by mouth daily.    [provider]    Family History Family History  Problem Relation Age of Onset  . High blood pressure Mother   . Stroke Mother   . Osteoarthritis Mother   . High blood pressure Father   . Arthritis Father   . Gout Father   . Neuropathy Neg Hx     Social History Social History   Tobacco Use  . Smoking status: Never Smoker  . Smokeless tobacco: Never Used  Substance Use Topics  . Alcohol use: Yes    Comment: special occasions  . Drug use: Never     Allergies   Oysters [shellfish allergy]   Review of Systems Review of Systems  Constitutional: Negative for fever.  HENT: Negative for sore throat.   Eyes: Negative for visual disturbance.  Respiratory: Negative for shortness of breath.   Cardiovascular: Negative for chest pain.  Gastrointestinal: Negative for abdominal pain.  Genitourinary: Positive for vaginal bleeding. Negative for dysuria.  Musculoskeletal: Positive for neck pain.  Skin: Negative for rash.  Neurological: Positive for numbness (tingling). Negative for headaches.  Physical Exam Updated Vital Signs BP 119/81 (BP Location: Right Arm)   Pulse 76   Temp 99.5 F (37.5 C) (Oral)   Resp 19   LMP 11/23/2018   SpO2 100%   Physical Exam Vitals signs and nursing note reviewed.  Constitutional:      General: She is not in acute distress.    Appearance: She is well-developed.  HENT:     Head: Normocephalic and atraumatic.  Eyes:     Conjunctiva/sclera: Conjunctivae normal.  Neck:     Musculoskeletal: Neck supple.  Cardiovascular:     Rate and Rhythm: Normal rate and regular rhythm.     Heart sounds: No murmur.  Pulmonary:     Effort: Pulmonary effort is normal. No respiratory  distress.     Breath sounds: Normal breath sounds.  Abdominal:     Palpations: Abdomen is soft.     Tenderness: There is no abdominal tenderness.  Musculoskeletal: Normal range of motion.        General: No swelling, tenderness, deformity or signs of injury.     Right lower leg: No edema.     Left lower leg: No edema.  Skin:    General: Skin is warm and dry.     Capillary Refill: Capillary refill takes less than 2 seconds.  Neurological:     General: No focal deficit present.     Mental Status: She is alert and oriented to person, place, and time.     Sensory: No sensory deficit.     Motor: No weakness.     Gait: Gait normal.      ED Treatments / Results  Labs (all labs ordered are listed, but only abnormal results are displayed) Labs Reviewed  BASIC METABOLIC PANEL - Abnormal; Notable for the following components:      Result Value   Potassium 3.3 (*)    BUN 5 (*)    All other components within normal limits  URINALYSIS, ROUTINE W REFLEX MICROSCOPIC - Abnormal; Notable for the following components:   Color, Urine STRAW (*)    Specific Gravity, Urine 1.004 (*)    Ketones, ur 20 (*)    Leukocytes,Ua TRACE (*)    Bacteria, UA RARE (*)    All other components within normal limits  CBC  PREGNANCY, URINE  I-STAT BETA HCG BLOOD, ED ([facility], [facility], [facility] ONLY)  TROPONIN I (HIGH SENSITIVITY)  TROPONIN I (HIGH SENSITIVITY)    EKG EKG Interpretation  Date/Time:  Sunday November 26 2018 11:55:21 EDT Ventricular Rate:  61 PR Interval:    QRS Duration: 80 QT Interval:  397 QTC Calculation: 400 R Axis:   47 Text Interpretation:  Sinus rhythm similar to prior 9/07 Confirmed by Meridee ScoreButler, Michael [phone]([id number]) on 11/26/2018 12:17:40 PM Also confirmed by Meridee ScoreButler, Michael [phone]), editor Barbette Hairassel, Kerry [phone]([id number])  on 11/26/2018 1:38:00 PM   Radiology Dg Chest 2 View  Result Date: 11/26/2018 CLINICAL DATA:  Multiple episodes of nocturnal hypertension for the past several weeks. EXAM: CHEST - 2 VIEW  COMPARISON:  None. FINDINGS: Normal sized heart. Clear lungs with normal vascularity. Minimal thoracic spine degenerative changes. IMPRESSION: No acute abnormality. Electronically Signed   By: Beckie SaltsSteven  Reid M.D.   On: 11/26/2018 10:39   Ct Head Wo Contrast  Result Date: 11/26/2018 CLINICAL DATA:  Subacute neurological deficit, not otherwise specified. EXAM: CT HEAD WITHOUT CONTRAST TECHNIQUE: Contiguous axial images were obtained from the base of the skull through the vertex without intravenous contrast. COMPARISON:  None. FINDINGS: Brain:  The brain shows a normal appearance without evidence of malformation, atrophy, old or acute small or large vessel infarction, mass lesion, hemorrhage, hydrocephalus or extra-axial collection. Vascular: No hyperdense vessel. No evidence of atherosclerotic calcification. Skull: Normal.  No traumatic finding.  No focal bone lesion. Sinuses/Orbits: Sinuses are clear. Orbits appear normal. Mastoids are clear. Other: None significant IMPRESSION: Normal head CT Electronically Signed   By: Paulina FusiMark  Shogry M.D.   On: 11/26/2018 11:19    Procedures Procedures (including critical care time)  Medications Ordered in ED Medications  sodium chloride flush (NS) 0.9 % injection 3 mL (has no administration in time range)     Initial Impression / Assessment and Plan / ED Course  I have reviewed the triage vital signs and the nursing notes.  Pertinent labs & imaging results that were available during my care of the patient were reviewed by me and considered in my medical decision making (see chart for details).  Clinical Course as of Nov 26 1727  Sun Nov 26, 2018  [id number] 38 year old female with unusual constellation of symptoms including tingling in her head tingling in her hands elevated blood pressures just at night along with intermittent vaginal bleeding and a pressure sensation in her right leg.  She is had CAT scans of her head and neck in the past and is following with  neurology and has an outpatient MRI ordered for next month.  Differential includes MS, anxiety, neuropathy, metabolic derangement.  She does have a low-grade fever here 99.5.  We will repeat her urinalysis and get some screening labs   [MB]  1024 Patient's chest x-ray does not show any gross abnormalities.   [MB]  1123 Patient's head CT unremarkable.  Lab work unremarkable other than a mildly low potassium at 3.3 which we will give her some oral repletion.  Initial troponin II and do not think she needs a delta.  Pregnancy test negative.  Awaiting on urinalysis and likely discharge to have her follow-up with her outpatient providers for continued management of her symptoms.   [MB]    Clinical Course User Index [MB] Terrilee FilesButler, Michael C, MD        Final Clinical Impressions(s) / ED Diagnoses   Final diagnoses:  Paresthesias  Hypokalemia    ED Discharge Orders    None       Terrilee FilesButler, Michael C, MD 11/26/18 1730

## 2018-11-27 ENCOUNTER — Other Ambulatory Visit (HOSPITAL_COMMUNITY)
Admission: RE | Admit: 2018-11-27 | Discharge: 2018-11-27 | Disposition: A | Discharge status: Home / Self Care | Source: Ambulatory Visit | Attending: Obstetrics and Gynecology | Admitting: Obstetrics and Gynecology

## 2018-11-27 ENCOUNTER — Other Ambulatory Visit: Payer: Self-pay | Admitting: Obstetrics and Gynecology

## 2018-11-27 DIAGNOSIS — Z01419 Encounter for gynecological examination (general) (routine) without abnormal findings: Secondary | ICD-10-CM | POA: Insufficient documentation

## 2018-11-28 ENCOUNTER — Ambulatory Visit
Admission: RE | Admit: 2018-11-28 | Discharge: 2018-11-28 | Disposition: A | Discharge status: Home / Self Care | Source: Ambulatory Visit | Attending: Physician Assistant | Admitting: Physician Assistant

## 2018-11-28 DIAGNOSIS — R590 Localized enlarged lymph nodes: Secondary | ICD-10-CM

## 2018-11-29 LAB — CYTOLOGY - PAP
Diagnosis: NEGATIVE
HPV: NOT DETECTED

## 2018-12-13 ENCOUNTER — Ambulatory Visit: Admitting: Internal Medicine

## 2018-12-19 ENCOUNTER — Other Ambulatory Visit: Payer: Self-pay

## 2018-12-21 ENCOUNTER — Encounter: Payer: Self-pay | Admitting: Neurology

## 2018-12-21 ENCOUNTER — Ambulatory Visit (INDEPENDENT_AMBULATORY_CARE_PROVIDER_SITE_OTHER): Payer: Self-pay | Admitting: Neurology

## 2018-12-21 ENCOUNTER — Telehealth: Payer: Self-pay | Admitting: Neurology

## 2018-12-21 DIAGNOSIS — Z5329 Procedure and treatment not carried out because of patient's decision for other reasons: Secondary | ICD-10-CM

## 2018-12-21 NOTE — Telephone Encounter (Signed)
Please do not reschedule for emg/ncs until speaking with Dr. Jaynee Eagles. Thanks. See email I sent below:  Hi Christina House, I was sorry to see you couldn't make your appointment today. I hope everything is ok with you! I wanted to say you are welcome to reschedule however if you do please make sure you can make it. When people do not show up to emg/ncs its very difficult, because this procedure is 2 appointments and we have blocked out 1.5 hours for you. So when people miss this test, the time could have gone to multiple other patients waiting months to have this procedure or just ave an appointment. But I totally understand if something came up and I really hope everything is ok with you ! Alternatively if you feel better please don;t feel you have to come in for the test. Have a wonderful weekend

## 2018-12-21 NOTE — Telephone Encounter (Signed)
Noted thanks °

## 2018-12-21 NOTE — Progress Notes (Signed)
Please do not reschedule for emg/ncs until discussing with Dr. Jaynee Eagles. This is the email I sent to patient:  Hi Christina House, I was sorry to see you couldn't make your appointment today. I hope everything is ok with you! I wanted to say you are welcome to reschedule however if you do please make sure you can make it. When people do not show up to emg/ncs its very difficult, because this procedure is 2 appointments and we have blocked out 1.5 hours for you. So when people miss this test, the time could have gone to multiple other patients waiting months to have this procedure or just ave an appointment. But I totally understand if something came up and I really hope everything is ok with you ! Alternatively if you feel better please don;t feel you have to come in for the test. Have a wonderful weekend

## 2021-02-07 IMAGING — CT CT HEAD WITHOUT CONTRAST
3 of 4 series · 16 of 47 positions shown, 19 images · non-contrast
Comparison: None.

CLINICAL DATA: Subacute neurological deficit, not otherwise
specified.

EXAM:
CT HEAD WITHOUT CONTRAST
TECHNIQUE: Contiguous axial images were obtained from the base of the skull
through the vertex without intravenous contrast.

[Series 4: head 2.0 h70h · axial · 0.48mm/px · z∈[-174,-32]mm · 10 of 79 slices shown, 13 images]
[im 4/79  brain]
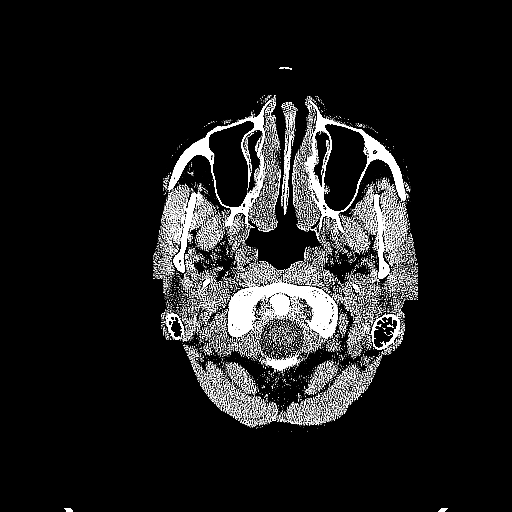
[im 4/79  bone]
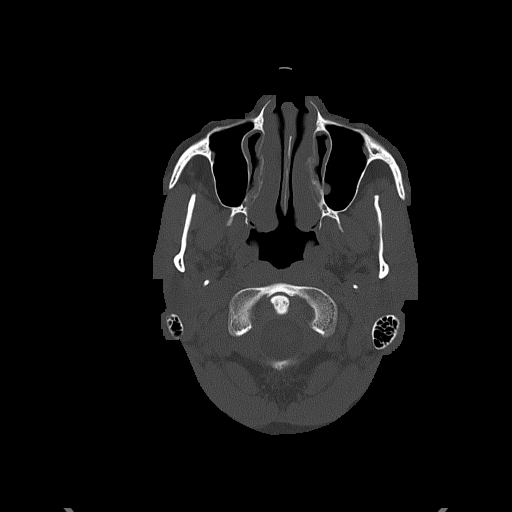
[im 12/79  brain]
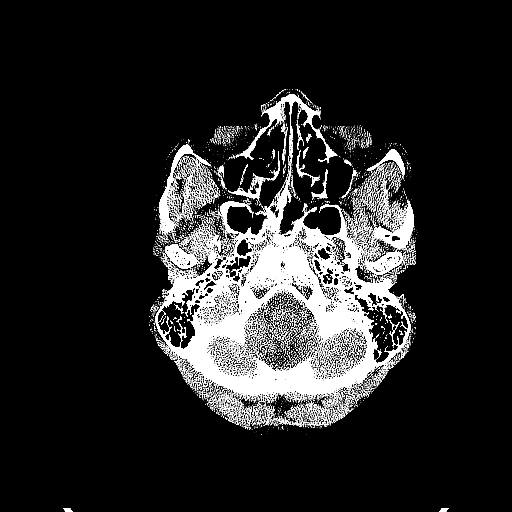
[im 20/79  brain]
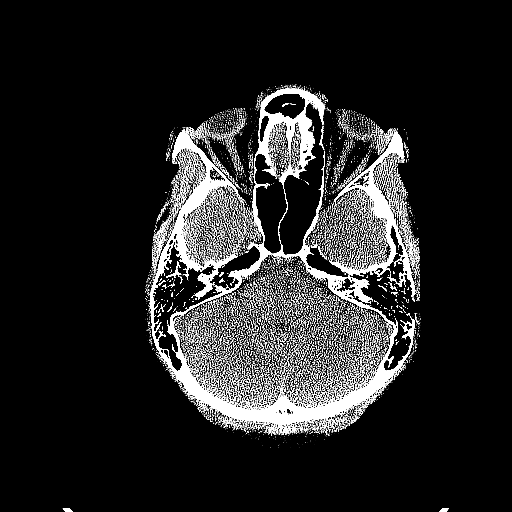
[im 28/79  brain]
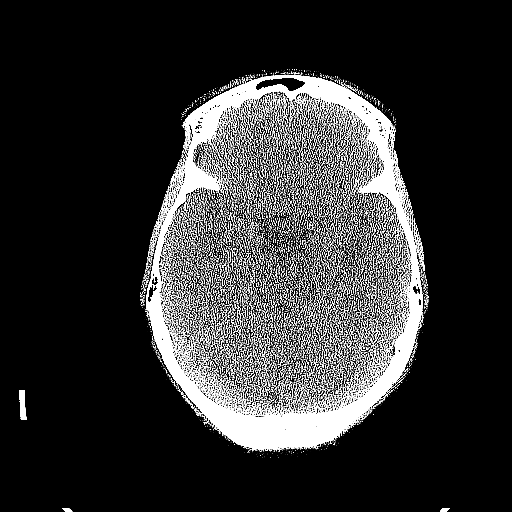
[im 36/79  brain]
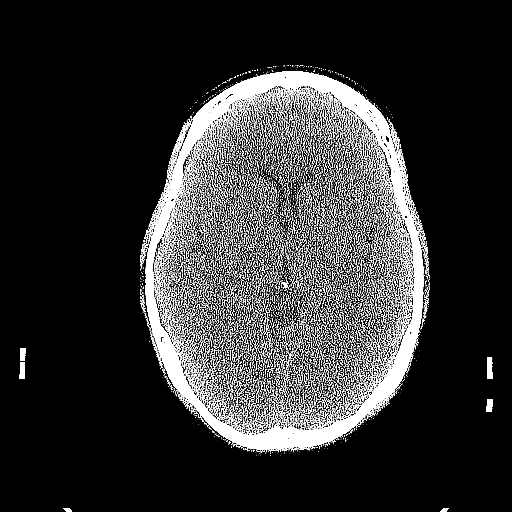
[im 36/79  bone]
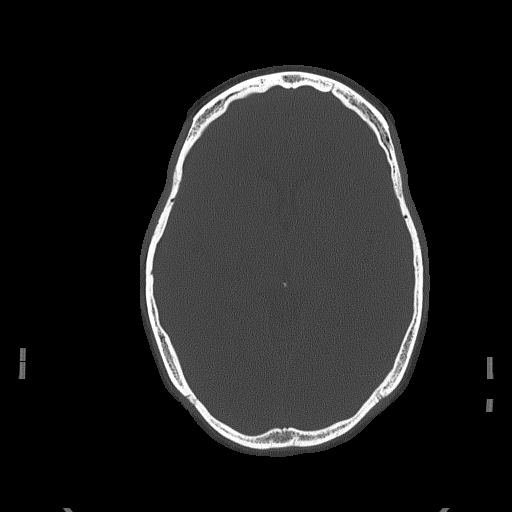
[im 43/79  brain]
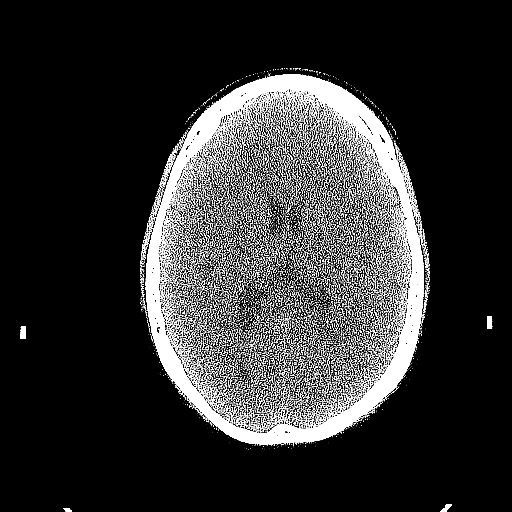
[im 51/79  brain]
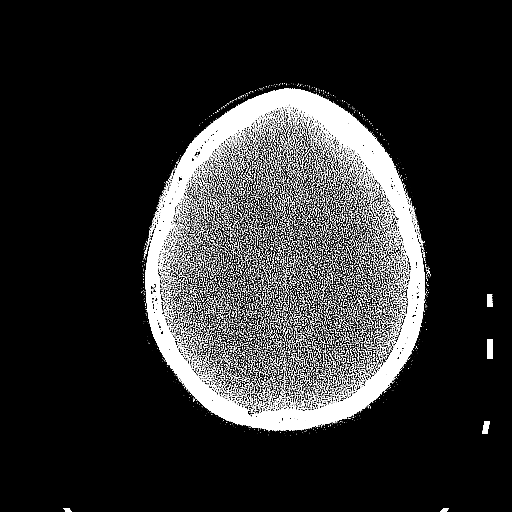
[im 59/79  brain]
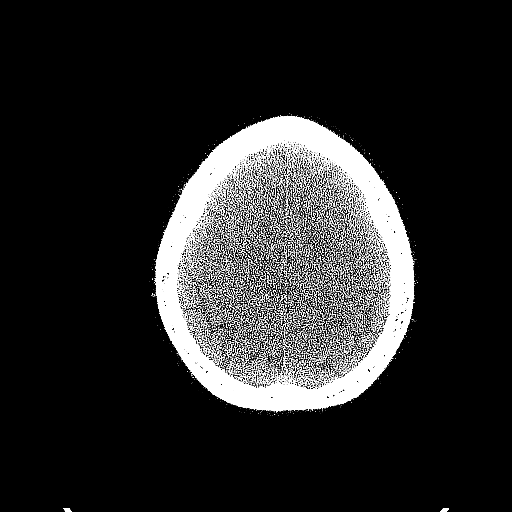
[im 67/79  brain]
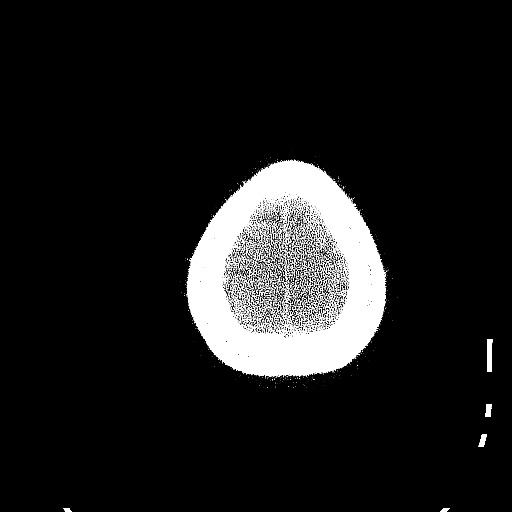
[im 67/79  bone]
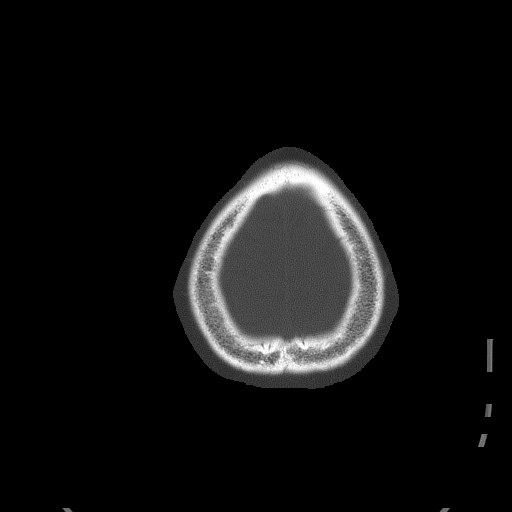
[im 75/79  brain]
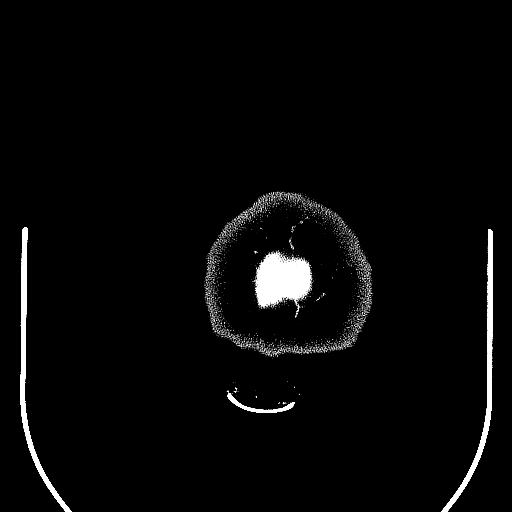

[Series 5: head 3.0 mpr cor · coronal · 0.34mm/px · 3 of 74 slices shown]
[im 25/74  brain]
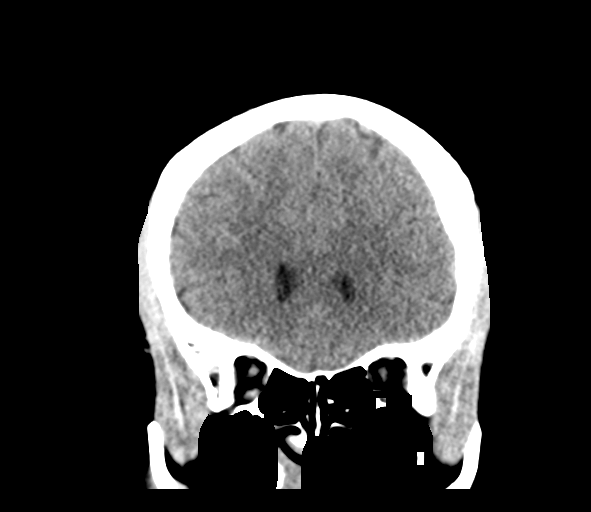
[im 33/74  brain]
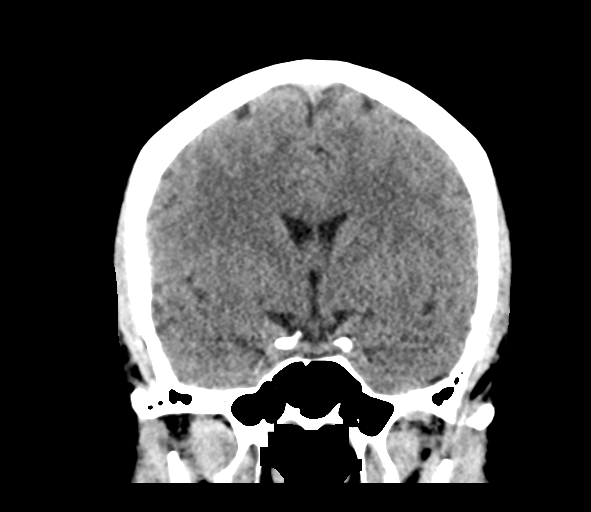
[im 41/74  brain]
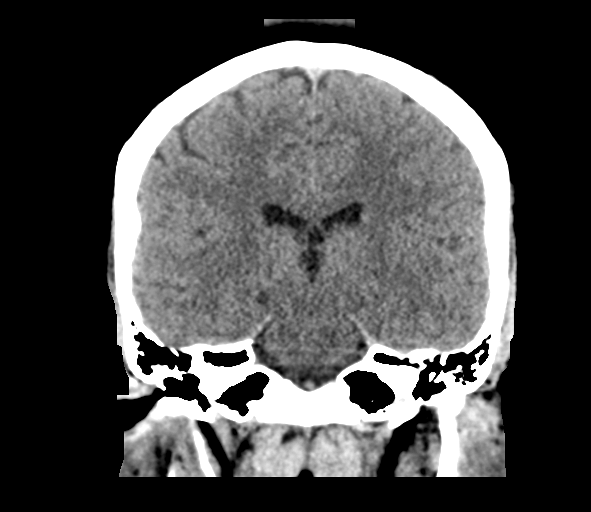

[Series 6: head 3.0 mpr sag · sagittal · 0.36mm/px · 3 of 67 slices shown]
[im 23/67  brain]
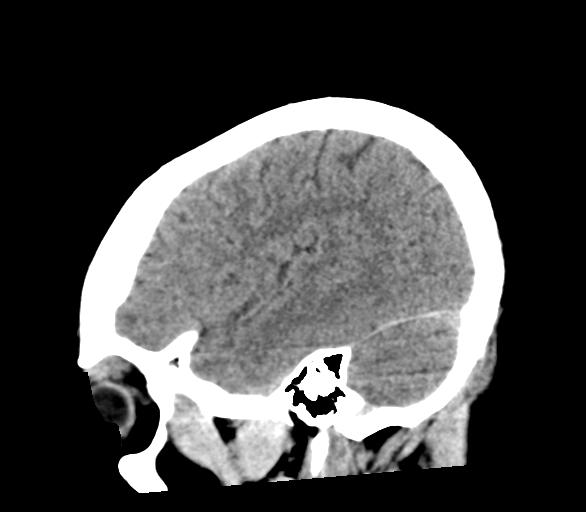
[im 34/67  brain]
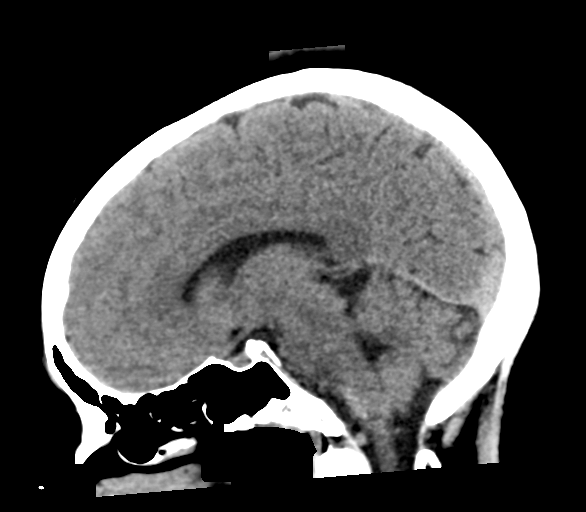
[im 45/67  brain]
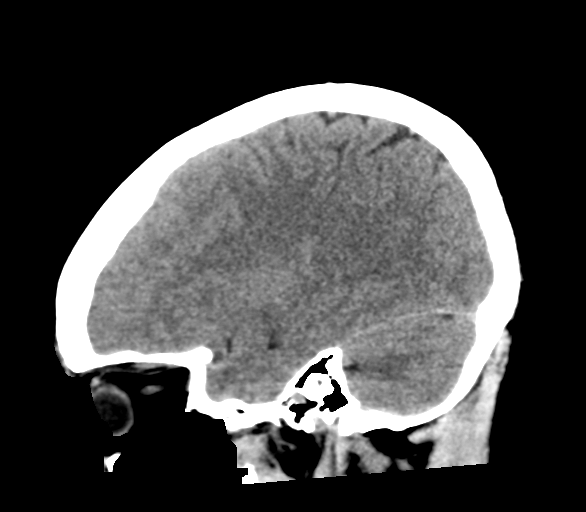

[16 of 47 positions shown; findings below may reference images not displayed]

FINDINGS: Brain: The brain shows a normal appearance without evidence of
malformation, atrophy, old or acute small or large vessel
infarction, mass lesion, hemorrhage, hydrocephalus or extra-axial
collection.

Vascular: No hyperdense vessel. No evidence of atherosclerotic
calcification.

Skull: Normal.  No traumatic finding.  No focal bone lesion.

Sinuses/Orbits: Sinuses are clear. Orbits appear normal. Mastoids
are clear.

Other: None significant
IMPRESSION: Normal head CT
